# Patient Record
Sex: Female | Born: 1975 | Race: White | Hispanic: No | Marital: Married | Smoking: Never smoker
Health system: Southern US, Community
[De-identification: ages and names within clinical notes are randomized; demographics above are authoritative.]

## PROBLEM LIST (undated history)

## (undated) ENCOUNTER — Inpatient Hospital Stay (HOSPITAL_COMMUNITY): Payer: Self-pay

## (undated) DIAGNOSIS — D069 Carcinoma in situ of cervix, unspecified: Secondary | ICD-10-CM

## (undated) HISTORY — PX: TONSILLECTOMY: SUR1361

## (undated) HISTORY — PX: BREAST ENHANCEMENT SURGERY: SHX7

## (undated) HISTORY — PX: COMBINED HYSTEROSCOPY DIAGNOSTIC / D&C: SUR297

## (undated) HISTORY — PX: COLPOSCOPY: SHX161

## (undated) HISTORY — PX: CERVIX LESION DESTRUCTION: SHX591

## (undated) HISTORY — PX: OTHER SURGICAL HISTORY: SHX169

## (undated) HISTORY — DX: Carcinoma in situ of cervix, unspecified: D06.9

---

## 1998-06-11 ENCOUNTER — Ambulatory Visit (HOSPITAL_COMMUNITY): Admission: RE | Admit: 1998-06-11 | Discharge: 1998-06-11 | Payer: Self-pay | Admitting: Obstetrics and Gynecology

## 1998-06-16 ENCOUNTER — Inpatient Hospital Stay (HOSPITAL_COMMUNITY): Admission: AD | Admit: 1998-06-16 | Discharge: 1998-06-16 | Payer: Self-pay | Admitting: Obstetrics and Gynecology

## 1998-12-07 ENCOUNTER — Other Ambulatory Visit: Admission: RE | Admit: 1998-12-07 | Discharge: 1998-12-07 | Payer: Self-pay | Admitting: Obstetrics and Gynecology

## 1999-03-29 ENCOUNTER — Other Ambulatory Visit: Admission: RE | Admit: 1999-03-29 | Discharge: 1999-03-29 | Payer: Self-pay | Admitting: Obstetrics and Gynecology

## 1999-05-24 ENCOUNTER — Ambulatory Visit (HOSPITAL_COMMUNITY): Admission: RE | Admit: 1999-05-24 | Discharge: 1999-05-24 | Payer: Self-pay | Admitting: Obstetrics and Gynecology

## 1999-05-24 ENCOUNTER — Encounter (INDEPENDENT_AMBULATORY_CARE_PROVIDER_SITE_OTHER): Payer: Self-pay | Admitting: *Deleted

## 1999-06-16 ENCOUNTER — Other Ambulatory Visit: Admission: RE | Admit: 1999-06-16 | Discharge: 1999-06-16 | Payer: Self-pay | Admitting: Obstetrics and Gynecology

## 2000-06-13 ENCOUNTER — Other Ambulatory Visit: Admission: RE | Admit: 2000-06-13 | Discharge: 2000-06-13 | Payer: Self-pay | Admitting: Obstetrics and Gynecology

## 2000-08-31 ENCOUNTER — Ambulatory Visit (HOSPITAL_COMMUNITY): Admission: RE | Admit: 2000-08-31 | Discharge: 2000-08-31 | Payer: Self-pay | Admitting: Obstetrics and Gynecology

## 2000-12-25 ENCOUNTER — Other Ambulatory Visit: Admission: RE | Admit: 2000-12-25 | Discharge: 2000-12-25 | Payer: Self-pay | Admitting: Obstetrics and Gynecology

## 2001-07-16 ENCOUNTER — Other Ambulatory Visit: Admission: RE | Admit: 2001-07-16 | Discharge: 2001-07-16 | Payer: Self-pay | Admitting: Obstetrics and Gynecology

## 2001-09-25 ENCOUNTER — Encounter: Payer: Self-pay | Admitting: Obstetrics and Gynecology

## 2001-09-25 ENCOUNTER — Encounter: Admission: RE | Admit: 2001-09-25 | Discharge: 2001-09-25 | Payer: Self-pay | Admitting: Obstetrics and Gynecology

## 2002-07-07 ENCOUNTER — Encounter: Payer: Self-pay | Admitting: Emergency Medicine

## 2002-07-07 ENCOUNTER — Emergency Department (HOSPITAL_COMMUNITY): Admission: EM | Admit: 2002-07-07 | Discharge: 2002-07-07 | Payer: Self-pay | Admitting: Emergency Medicine

## 2002-09-19 ENCOUNTER — Other Ambulatory Visit: Admission: RE | Admit: 2002-09-19 | Discharge: 2002-09-19 | Payer: Self-pay | Admitting: Obstetrics and Gynecology

## 2003-09-21 ENCOUNTER — Other Ambulatory Visit: Admission: RE | Admit: 2003-09-21 | Discharge: 2003-09-21 | Payer: Self-pay | Admitting: Obstetrics and Gynecology

## 2004-10-13 ENCOUNTER — Other Ambulatory Visit: Admission: RE | Admit: 2004-10-13 | Discharge: 2004-10-13 | Payer: Self-pay | Admitting: Obstetrics and Gynecology

## 2005-02-20 ENCOUNTER — Other Ambulatory Visit: Admission: RE | Admit: 2005-02-20 | Discharge: 2005-02-20 | Payer: Self-pay | Admitting: Gynecology

## 2005-09-11 ENCOUNTER — Inpatient Hospital Stay (HOSPITAL_COMMUNITY): Admission: AD | Admit: 2005-09-11 | Discharge: 2005-09-14 | Payer: Self-pay | Admitting: Gynecology

## 2005-10-23 ENCOUNTER — Other Ambulatory Visit: Admission: RE | Admit: 2005-10-23 | Discharge: 2005-10-23 | Payer: Self-pay | Admitting: Gynecology

## 2006-11-01 ENCOUNTER — Other Ambulatory Visit: Admission: RE | Admit: 2006-11-01 | Discharge: 2006-11-01 | Payer: Self-pay | Admitting: Obstetrics and Gynecology

## 2007-03-14 ENCOUNTER — Ambulatory Visit: Payer: Self-pay | Admitting: Internal Medicine

## 2007-03-14 DIAGNOSIS — IMO0001 Reserved for inherently not codable concepts without codable children: Secondary | ICD-10-CM | POA: Insufficient documentation

## 2007-03-14 DIAGNOSIS — G44209 Tension-type headache, unspecified, not intractable: Secondary | ICD-10-CM

## 2007-11-29 ENCOUNTER — Other Ambulatory Visit: Admission: RE | Admit: 2007-11-29 | Discharge: 2007-11-29 | Payer: Self-pay | Admitting: Obstetrics and Gynecology

## 2008-06-16 ENCOUNTER — Ambulatory Visit: Payer: Self-pay | Admitting: Family Medicine

## 2008-06-16 DIAGNOSIS — J1189 Influenza due to unidentified influenza virus with other manifestations: Secondary | ICD-10-CM | POA: Insufficient documentation

## 2008-10-16 ENCOUNTER — Telehealth: Payer: Self-pay | Admitting: Family Medicine

## 2009-06-24 ENCOUNTER — Other Ambulatory Visit: Admission: RE | Admit: 2009-06-24 | Discharge: 2009-06-24 | Payer: Self-pay | Admitting: Obstetrics and Gynecology

## 2009-06-24 ENCOUNTER — Encounter: Payer: Self-pay | Admitting: Obstetrics and Gynecology

## 2009-06-24 ENCOUNTER — Ambulatory Visit: Payer: Self-pay | Admitting: Obstetrics and Gynecology

## 2009-09-15 ENCOUNTER — Ambulatory Visit: Payer: Self-pay | Admitting: Obstetrics and Gynecology

## 2009-09-20 ENCOUNTER — Ambulatory Visit (HOSPITAL_BASED_OUTPATIENT_CLINIC_OR_DEPARTMENT_OTHER): Admission: RE | Admit: 2009-09-20 | Discharge: 2009-09-20 | Payer: Self-pay | Admitting: Obstetrics and Gynecology

## 2009-09-20 ENCOUNTER — Ambulatory Visit: Payer: Self-pay | Admitting: Obstetrics and Gynecology

## 2009-09-22 ENCOUNTER — Ambulatory Visit: Payer: Self-pay | Admitting: Obstetrics and Gynecology

## 2009-09-24 ENCOUNTER — Ambulatory Visit: Payer: Self-pay | Admitting: Obstetrics and Gynecology

## 2009-09-29 ENCOUNTER — Ambulatory Visit: Payer: Self-pay | Admitting: Gynecology

## 2009-10-04 ENCOUNTER — Ambulatory Visit: Payer: Self-pay | Admitting: Gynecology

## 2009-10-12 ENCOUNTER — Ambulatory Visit: Payer: Self-pay | Admitting: Obstetrics and Gynecology

## 2009-10-26 ENCOUNTER — Ambulatory Visit: Payer: Self-pay | Admitting: Obstetrics and Gynecology

## 2009-11-22 ENCOUNTER — Ambulatory Visit: Payer: Self-pay | Admitting: Obstetrics and Gynecology

## 2010-06-27 ENCOUNTER — Other Ambulatory Visit: Admission: RE | Admit: 2010-06-27 | Discharge: 2010-06-27 | Payer: Self-pay | Admitting: Obstetrics and Gynecology

## 2010-06-27 ENCOUNTER — Ambulatory Visit: Payer: Self-pay | Admitting: Obstetrics and Gynecology

## 2010-12-05 ENCOUNTER — Ambulatory Visit (INDEPENDENT_AMBULATORY_CARE_PROVIDER_SITE_OTHER): Payer: 59 | Admitting: Obstetrics and Gynecology

## 2010-12-05 DIAGNOSIS — Z30431 Encounter for routine checking of intrauterine contraceptive device: Secondary | ICD-10-CM

## 2010-12-30 NOTE — Op Note (Signed)
[facility]  Patient:    Ann Haas, Ann Haas                    MRN: [id number] Proc. Date: 08/31/00 Adm. Date:  [id number] Disc. Date: [id number] Attending:  Sharon Mt                           Operative Report  PREOPERATIVE DIAGNOSES:  Symptomatic hymenal remnant.  POSTOPERATIVE DIAGNOSES:  Symptomatic hymenal remnant.  OPERATION PERFORMED:  Excision of hymenal remnant.  SURGEON:  Dr. Eda Paschal.  ANESTHESIA:  MAC plus local infiltrate of anesthesia.  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 It has caused both irritation with clothing as well as dyspareunia and as a result of this, she has elected to have this removed.  FINDINGS:  External examination shows a very long hymenal remnant coming off near the urethra. It did not appear to have any continuity with the urethra itself.  DESCRIPTION OF PROCEDURE:  The patient was taken to the operating room, placed in the dorsal lithotomy position, prepped and draped in the usual sterile fashion. She was given IV sedation by anesthesia and local 1% xylocaine infiltrated with anesthesia by Dr. Eda Paschal. Initially the hymenial remnant was removed with a Bovie using both coag and cutting. Care was taken to stay far from the integrity of the urethra. Once the area had been excised, there was a mucosa defect that had been created. A Robinson catheter was placed in the urethra to be sure that the urethra had not been altered in anyway and it had not. Urine that drained at this point was clear. Using interrupted 3-0 Monocryls, the mucosal defect was closed. It took three silk sutures to close. Estimated blood loss was less than 10 cc with none replaced. The patient tolerated the procedure well and left the operating room in satisfactory condition. DD:  08/31/00 TD:  09/01/00 Job: [id number]

## 2011-01-16 ENCOUNTER — Ambulatory Visit: Payer: 59 | Admitting: Obstetrics and Gynecology

## 2011-01-24 ENCOUNTER — Ambulatory Visit: Payer: 59 | Admitting: Obstetrics and Gynecology

## 2011-01-31 ENCOUNTER — Ambulatory Visit (INDEPENDENT_AMBULATORY_CARE_PROVIDER_SITE_OTHER): Payer: 59 | Admitting: Obstetrics and Gynecology

## 2011-01-31 DIAGNOSIS — N912 Amenorrhea, unspecified: Secondary | ICD-10-CM

## 2011-07-17 ENCOUNTER — Ambulatory Visit (INDEPENDENT_AMBULATORY_CARE_PROVIDER_SITE_OTHER): Payer: No Typology Code available for payment source | Admitting: Family Medicine

## 2011-07-17 ENCOUNTER — Encounter: Payer: Self-pay | Admitting: Family Medicine

## 2011-07-17 VITALS — BP 98/66 | HR 69 | Temp 98.6°F | Wt 112.0 lb

## 2011-07-17 DIAGNOSIS — J329 Chronic sinusitis, unspecified: Secondary | ICD-10-CM

## 2011-07-17 MED ORDER — LEVOFLOXACIN 500 MG PO TABS: 500.0000 mg | ORAL_TABLET | Freq: Every day | ORAL | Status: DC

## 2011-07-17 MED ORDER — FLUTICASONE PROPIONATE 50 MCG/ACT NA SUSP: 2.0000 | Freq: Every day | NASAL | Status: DC

## 2011-07-17 MED ORDER — FLUCONAZOLE 150 MG PO TABS: 150.0000 mg | ORAL_TABLET | Freq: Once | ORAL | Status: AC

## 2011-07-17 NOTE — Progress Notes (Signed)
  Subjective:    Patient ID: Lauretta Grill, female    DOB: 05-20-1976, 35 y.o.   MRN: [id number]  HPI Here for recurrent sinus infections. She has had 4 of these in the past year, and autumn seems to be the worst time of year for her. She went to Urgent Care about a week ago for the typical sinus pressure, PND, HA, blowing green mucus from the nose, and a dry cough. No fever. She was put on Biaxin, but this has not helped. She tends to have some allergy symptoms most of the time like nasal congestion and sneezing.    Review of Systems  Constitutional: Negative.   HENT: Positive for congestion, postnasal drip and sinus pressure.   Eyes: Negative.   Respiratory: Positive for cough.        Objective:   Physical Exam  Constitutional: She appears well-developed and well-nourished.  HENT:  Right Ear: External ear normal.  Left Ear: External ear normal.  Nose: Nose normal.  Mouth/Throat: Oropharynx is clear and moist. No oropharyngeal exudate.  Eyes: Conjunctivae are normal. Pupils are equal, round, and reactive to light.  Neck: No thyromegaly present.  Pulmonary/Chest: Effort normal and breath sounds normal.  Lymphadenopathy:    She has no cervical adenopathy.          Assessment & Plan:  For the current infection, stop the Biaxin and start on 14 days of Levquin. For the allergies, start on Claritin daily and Flonase sprays daily. Get a sinus CT scan to loof for any structural issues.

## 2011-07-19 ENCOUNTER — Telehealth: Payer: Self-pay | Admitting: Family Medicine

## 2011-07-19 NOTE — Telephone Encounter (Signed)
I spoke to her about this. She will stop the Levaquin and go back on the Biaxin she had gotten at the Urgent Care clinic. She does feel better now. Her sinus CT is set up for next week.

## 2011-07-19 NOTE — Telephone Encounter (Signed)
Pt called and is having reaction to abx levofloxacin (LEVAQUIN) 500 MG tablet . Pt is having anxiety issues, panic attacks and is shaky. Pt is due to take this med again at 12:30pm today, and pt does not want to take med again. Pt doen't know if she should start back on old abx or what?

## 2011-07-26 ENCOUNTER — Other Ambulatory Visit: Payer: Self-pay | Admitting: Family Medicine

## 2011-07-26 ENCOUNTER — Ambulatory Visit (INDEPENDENT_AMBULATORY_CARE_PROVIDER_SITE_OTHER)
Admission: RE | Admit: 2011-07-26 | Discharge: 2011-07-26 | Disposition: A | Discharge status: Home / Self Care | Payer: No Typology Code available for payment source | Source: Ambulatory Visit | Attending: Family Medicine | Admitting: Family Medicine

## 2011-07-26 DIAGNOSIS — J329 Chronic sinusitis, unspecified: Secondary | ICD-10-CM

## 2011-07-27 NOTE — Progress Notes (Signed)
Quick Note:  Left voice message ______ 

## 2011-07-28 ENCOUNTER — Telehealth: Payer: Self-pay | Admitting: Family Medicine

## 2011-07-28 DIAGNOSIS — J320 Chronic maxillary sinusitis: Secondary | ICD-10-CM

## 2011-07-28 NOTE — Telephone Encounter (Signed)
Left voice message.

## 2011-07-28 NOTE — Telephone Encounter (Signed)
Pt called and would like the referral for the ENT, no one in particular. ( referral due to recent scan )

## 2011-07-28 NOTE — Telephone Encounter (Signed)
Tell her I did the referral to [facility]. Camelia Eng will call with the appt info next week

## 2011-08-11 ENCOUNTER — Encounter: Payer: 59 | Admitting: Obstetrics and Gynecology

## 2011-09-08 ENCOUNTER — Ambulatory Visit (HOSPITAL_BASED_OUTPATIENT_CLINIC_OR_DEPARTMENT_OTHER)
Admission: RE | Admit: 2011-09-08 | Payer: No Typology Code available for payment source | Source: Ambulatory Visit | Admitting: Otolaryngology

## 2011-09-08 ENCOUNTER — Encounter (HOSPITAL_BASED_OUTPATIENT_CLINIC_OR_DEPARTMENT_OTHER): Admission: RE | Payer: Self-pay | Source: Ambulatory Visit

## 2011-09-08 SURGERY — MAXILLARY ANTROSTOMY
Anesthesia: General | Laterality: Bilateral

## 2011-09-27 ENCOUNTER — Encounter: Payer: Self-pay | Admitting: Obstetrics and Gynecology

## 2011-09-27 ENCOUNTER — Ambulatory Visit (INDEPENDENT_AMBULATORY_CARE_PROVIDER_SITE_OTHER): Payer: No Typology Code available for payment source | Admitting: Obstetrics and Gynecology

## 2011-09-27 ENCOUNTER — Other Ambulatory Visit (HOSPITAL_COMMUNITY)
Admission: RE | Admit: 2011-09-27 | Discharge: 2011-09-27 | Disposition: A | Discharge status: Home / Self Care | Payer: No Typology Code available for payment source | Source: Ambulatory Visit | Attending: Obstetrics and Gynecology | Admitting: Obstetrics and Gynecology

## 2011-09-27 DIAGNOSIS — Z30431 Encounter for routine checking of intrauterine contraceptive device: Secondary | ICD-10-CM

## 2011-09-27 DIAGNOSIS — Z01419 Encounter for gynecological examination (general) (routine) without abnormal findings: Secondary | ICD-10-CM | POA: Insufficient documentation

## 2011-09-27 DIAGNOSIS — Z833 Family history of diabetes mellitus: Secondary | ICD-10-CM

## 2011-09-27 LAB — CBC WITH DIFFERENTIAL/PLATELET
Eosinophils Absolute: 0.1 10*3/uL (ref 0.0–0.7)
Eosinophils Relative: 1 % (ref 0–5)
HCT: 39.6 % (ref 36.0–46.0)
Hemoglobin: 13.7 g/dL (ref 12.0–15.0)
Lymphocytes Relative: 34 % (ref 12–46)
Lymphs Abs: 2.8 10*3/uL (ref 0.7–4.0)
MCH: 31.2 pg (ref 26.0–34.0)
MCV: 90.2 fL (ref 78.0–100.0)
Monocytes Absolute: 0.5 10*3/uL (ref 0.1–1.0)
Monocytes Relative: 7 % (ref 3–12)
RBC: 4.39 MIL/uL (ref 3.87–5.11)
WBC: 8 10*3/uL (ref 4.0–10.5)

## 2011-09-27 LAB — URINALYSIS W MICROSCOPIC + REFLEX CULTURE
Bilirubin Urine: NEGATIVE
Hgb urine dipstick: NEGATIVE
Ketones, ur: NEGATIVE mg/dL
Nitrite: NEGATIVE
Protein, ur: NEGATIVE mg/dL
Specific Gravity, Urine: 1.015 (ref 1.005–1.030)
Urobilinogen, UA: 0.2 mg/dL (ref 0.0–1.0)

## 2011-09-27 NOTE — Progress Notes (Signed)
Patient came to see me today for her annual GYN exam. She is amenorrheic on her IUD. She is thinking of having another child. She does not want to remove the IUD yet. She is having problems with recurrent sinusitis that she sees her PCP for. Otherwise she is doing well. She is having no pelvic pain.  Physical examination: Kennon Portela present. HEENT within normal limits. Neck: Thyroid not large. No masses. Supraclavicular nodes: not enlarged. Breasts: Examined in both sitting midline position. No skin changes and no masses. Abdomen: Soft no guarding rebound or masses or hernia. Pelvic: External: Within normal limits. BUS: Within normal limits. Vaginal:within normal limits. Good estrogen effect. No evidence of cystocele rectocele or enterocele. Cervix: clean. IUD string visible. Uterus: Normal size and shape. Adnexa: No masses. Rectovaginal exam: Confirmatory and negative. Extremities: Within normal limits.  Assessment: Normal GYN exam  Plan: Return for Mirena IUD removal when she is ready to conceive.

## 2012-03-08 ENCOUNTER — Ambulatory Visit: Payer: No Typology Code available for payment source | Admitting: Gynecology

## 2012-03-15 ENCOUNTER — Ambulatory Visit: Payer: No Typology Code available for payment source | Admitting: Obstetrics and Gynecology

## 2012-10-03 ENCOUNTER — Encounter: Payer: No Typology Code available for payment source | Admitting: Gynecology

## 2012-10-15 ENCOUNTER — Encounter: Payer: No Typology Code available for payment source | Admitting: Gynecology

## 2012-11-08 ENCOUNTER — Encounter: Payer: No Typology Code available for payment source | Admitting: Gynecology

## 2013-01-16 IMAGING — CT CT PARANASAL SINUSES LIMITED
1 of 2 series · 16 of 19 positions shown, 20 images · non-contrast
Comparison: None.

CLINICAL DATA: 35-year-old female with recurrent sinus infections,
most recently 2 2 weeks ago.

CT LIMITED SINUSES WITHOUT CONTRAST
TECHNIQUE: Multidetector CT images of the paranasal sinuses were
obtained in a single plane without contrast.

[Series 4: ltd sinus 3.0 h30s · axial · 0.36mm/px · z∈[-104,-7]mm · 16 of 18 slices shown, 20 images]
[im 2/18  brain]
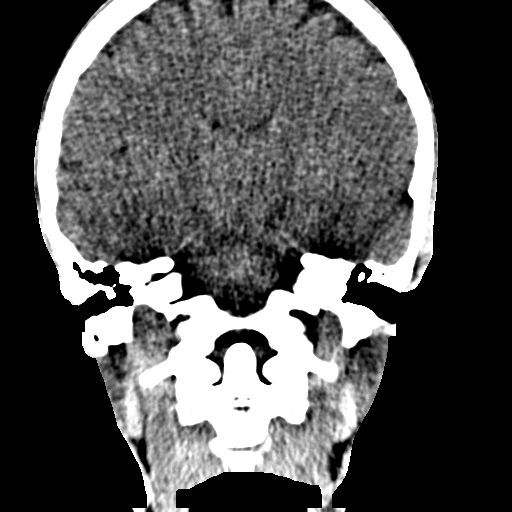
[im 2/18  bone]
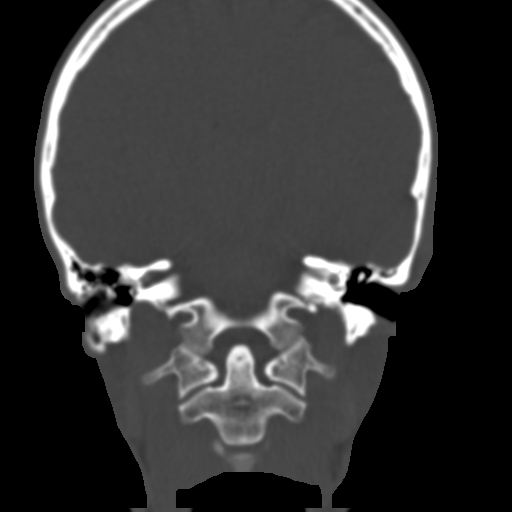
[im 3/18  bone]
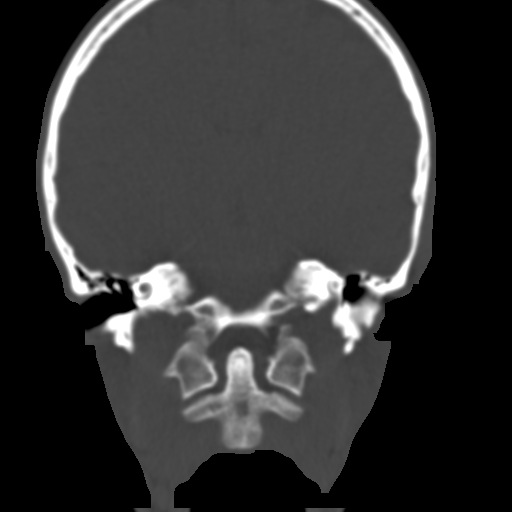
[im 4/18  bone]
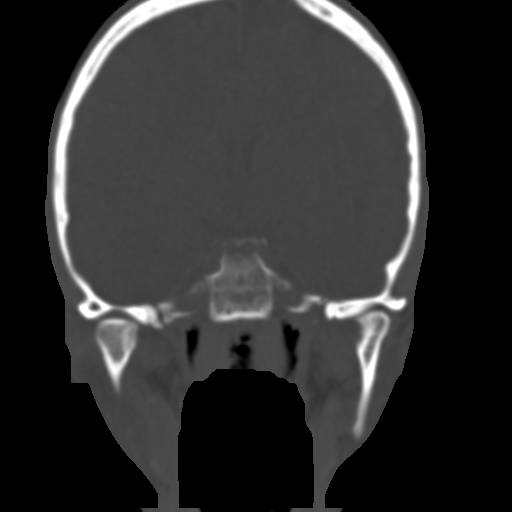
[im 5/18  bone]
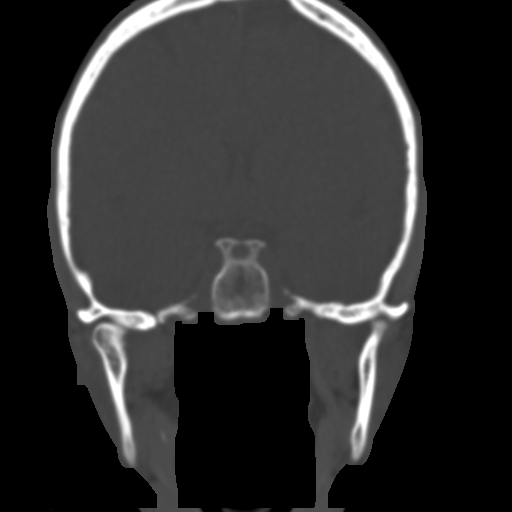
[im 6/18  brain]
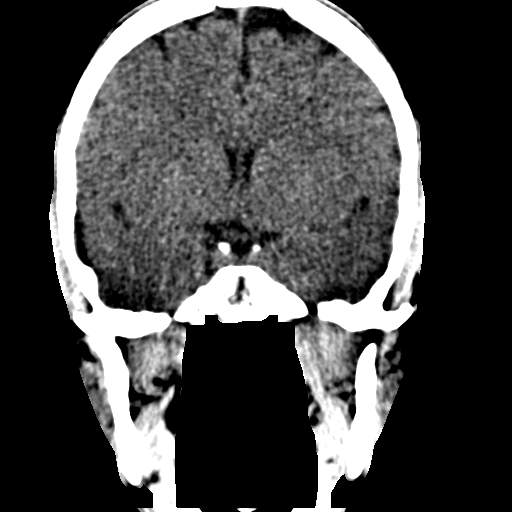
[im 6/18  bone]
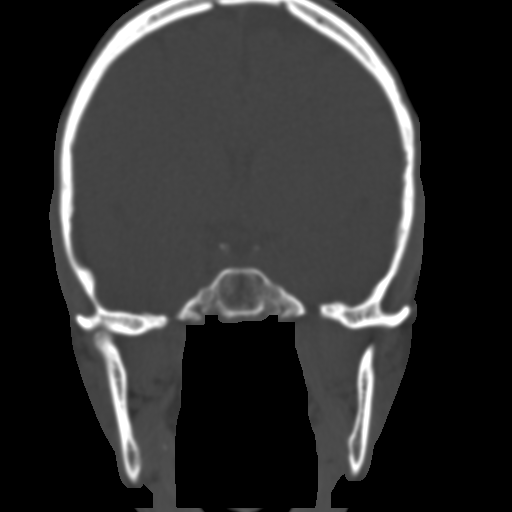
[im 7/18  bone]
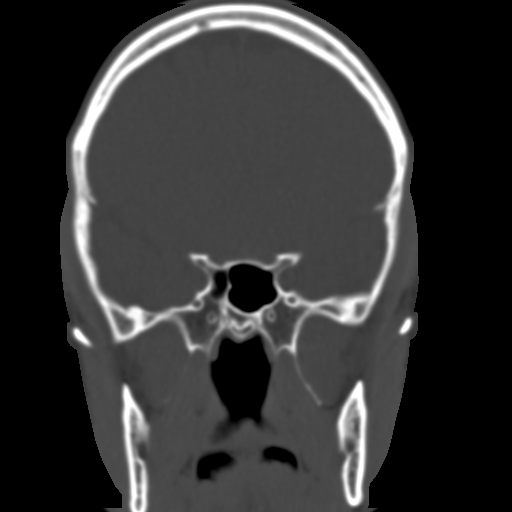
[im 8/18  bone]
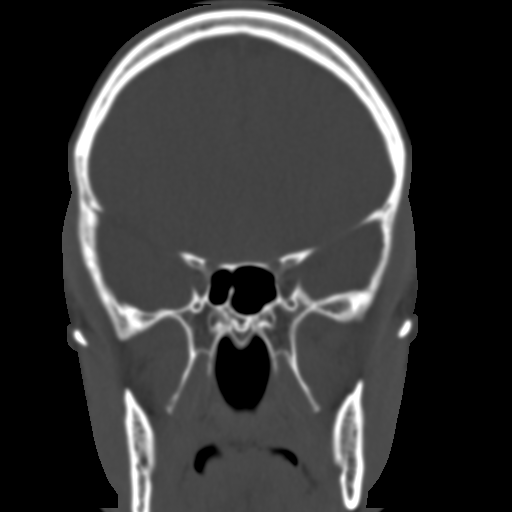
[im 9/18  bone]
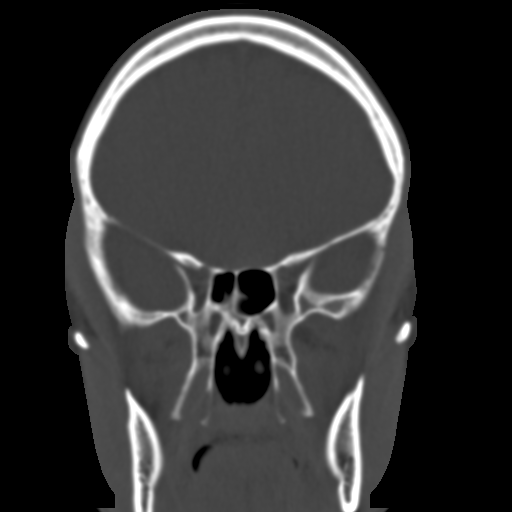
[im 10/18  brain]
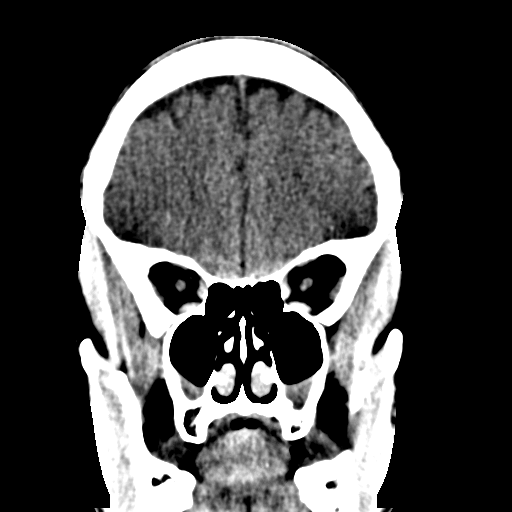
[im 10/18  bone]
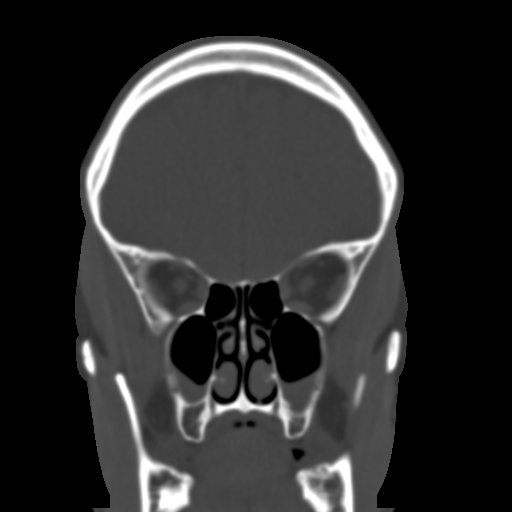
[im 11/18  bone]
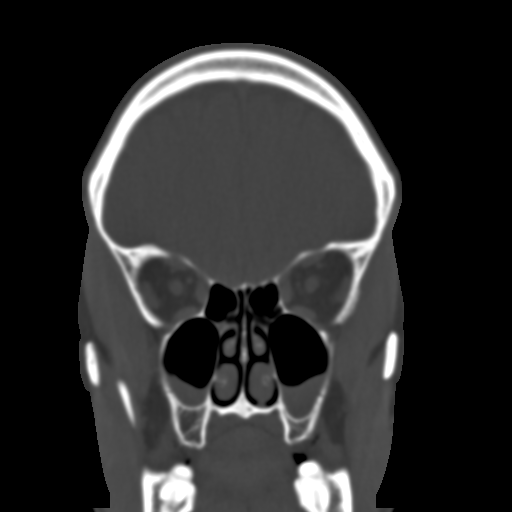
[im 12/18  bone]
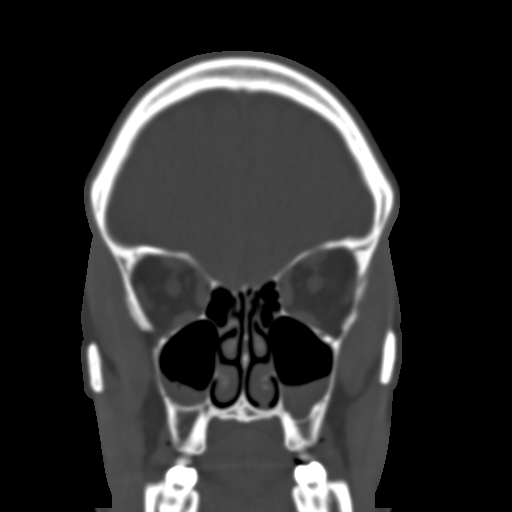
[im 13/18  bone]
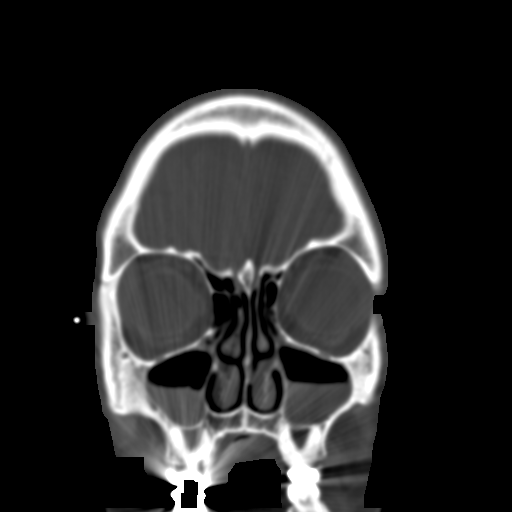
[im 14/18  brain]
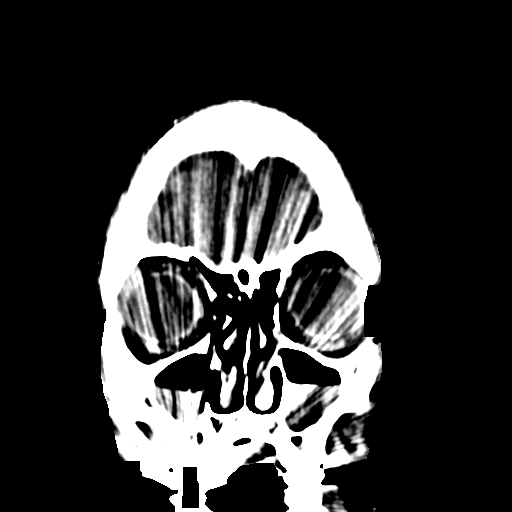
[im 14/18  bone]
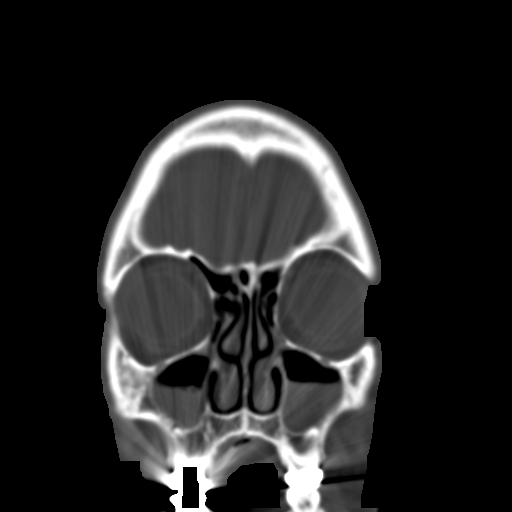
[im 15/18  bone]
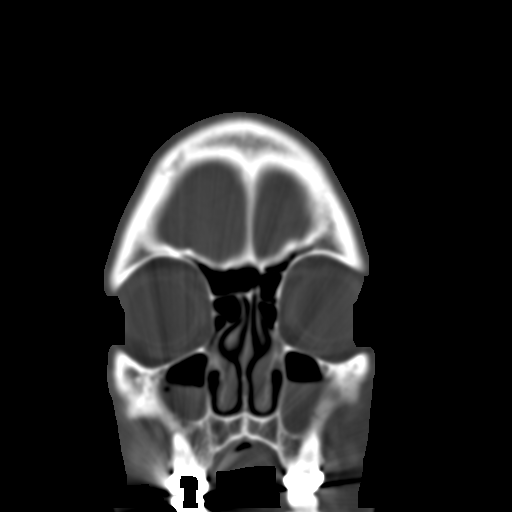
[im 16/18  bone]
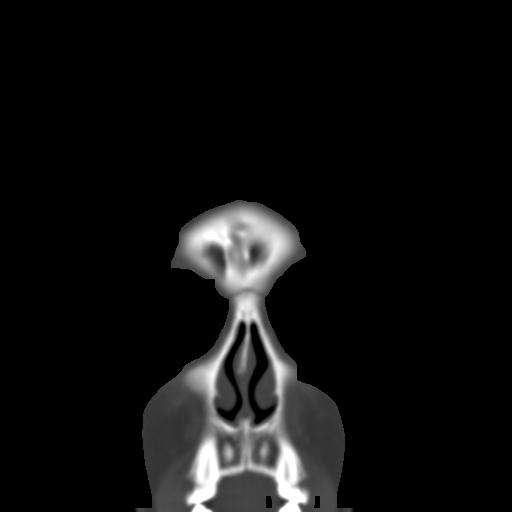
[im 17/18  bone]
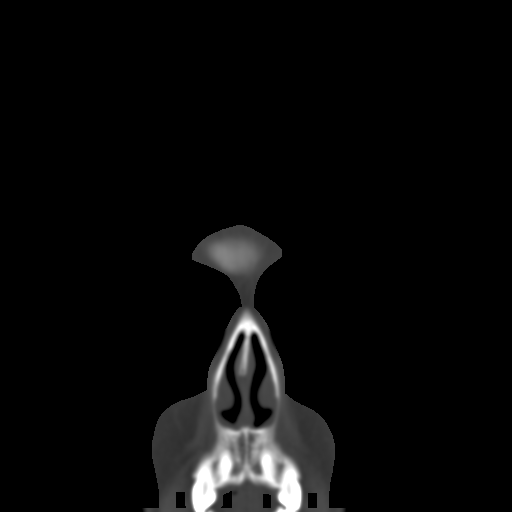

[16 of 19 positions shown; findings below may reference images not displayed]

FINDINGS: Negative visualized noncontrast brain parenchyma.
Visualized orbit soft tissues are within normal limits.  Negative
visualized face soft tissues.

Visualized tympanic cavities and mastoids are clear.

The sphenoid sinuses are clear.
The posterior ethmoids are clear.  There is minimal to mild mucosal
thickening in the anterior ethmoids.
The visualized frontal sinuses are clear.
There are fluid levels in both maxillary sinuses.

Suggestion of nasal cavity mucosal thickening.

No acute osseous abnormality identified.
IMPRESSION: Bilateral maxillary sinus air-fluid levels compatible with acute
sinusitis.

## 2013-04-16 LAB — OB RESULTS CONSOLE ABO/RH: RH Type: POSITIVE

## 2013-04-16 LAB — OB RESULTS CONSOLE GC/CHLAMYDIA
CHLAMYDIA, DNA PROBE: NEGATIVE
GC PROBE AMP, GENITAL: NEGATIVE

## 2013-04-16 LAB — OB RESULTS CONSOLE HEPATITIS B SURFACE ANTIGEN: Hepatitis B Surface Ag: NEGATIVE

## 2013-04-16 LAB — OB RESULTS CONSOLE RUBELLA ANTIBODY, IGM: Rubella: IMMUNE

## 2013-04-16 LAB — OB RESULTS CONSOLE HIV ANTIBODY (ROUTINE TESTING): HIV: NONREACTIVE

## 2013-04-16 LAB — OB RESULTS CONSOLE ANTIBODY SCREEN: Antibody Screen: NEGATIVE

## 2013-04-16 LAB — OB RESULTS CONSOLE RPR: RPR: NONREACTIVE

## 2013-07-01 ENCOUNTER — Encounter (HOSPITAL_COMMUNITY): Payer: Self-pay | Admitting: *Deleted

## 2013-07-01 ENCOUNTER — Inpatient Hospital Stay (HOSPITAL_COMMUNITY): Payer: BC Managed Care – PPO

## 2013-07-01 ENCOUNTER — Inpatient Hospital Stay (HOSPITAL_COMMUNITY)
Admission: AD | Admit: 2013-07-01 | Discharge: 2013-07-01 | Disposition: A | Discharge status: Home / Self Care | Payer: BC Managed Care – PPO | Source: Ambulatory Visit | Attending: Obstetrics and Gynecology | Admitting: Obstetrics and Gynecology

## 2013-07-01 DIAGNOSIS — R0602 Shortness of breath: Secondary | ICD-10-CM | POA: Insufficient documentation

## 2013-07-01 DIAGNOSIS — O99891 Other specified diseases and conditions complicating pregnancy: Secondary | ICD-10-CM | POA: Insufficient documentation

## 2013-07-01 DIAGNOSIS — IMO0002 Reserved for concepts with insufficient information to code with codable children: Secondary | ICD-10-CM | POA: Insufficient documentation

## 2013-07-01 LAB — COMPREHENSIVE METABOLIC PANEL
ALT: 15 U/L (ref 0–35)
AST: 25 U/L (ref 0–37)
CO2: 27 mEq/L (ref 19–32)
Calcium: 9 mg/dL (ref 8.4–10.5)
Chloride: 103 mEq/L (ref 96–112)
Creatinine, Ser: 0.45 mg/dL — ABNORMAL LOW (ref 0.50–1.10)
GFR calc Af Amer: >90 mL/min (ref >90)
GFR calc non Af Amer: >90 mL/min (ref >90)
Glucose, Bld: 70 mg/dL (ref 70–99)
Total Bilirubin: 0.1 mg/dL — ABNORMAL LOW (ref 0.3–1.2)

## 2013-07-01 LAB — CBC
HCT: 34.1 % — ABNORMAL LOW (ref 36.0–46.0)
Hemoglobin: 11.9 g/dL — ABNORMAL LOW (ref 12.0–15.0)
MCH: 30.8 pg (ref 26.0–34.0)
MCV: 88.3 fL (ref 78.0–100.0)
Platelets: 133 10*3/uL — ABNORMAL LOW (ref 150–400)
RBC: 3.86 MIL/uL — ABNORMAL LOW (ref 3.87–5.11)
WBC: 10.4 10*3/uL (ref 4.0–10.5)

## 2013-07-01 NOTE — MAU Note (Signed)
EKG being done

## 2013-07-01 NOTE — MAU Note (Signed)
Been feeling great up until yesterday, 'all this swelling'.  9#wk gain noted at office per Dr Henderson Cloud. Reports a little short of breath. Pain from swelling, in lower extremities.

## 2013-08-14 NOTE — L&D Delivery Note (Signed)
Delivery Note At 2:54 AM a viable female was delivered via Vaginal, Spontaneous Delivery (Presentation: Left Occiput Anterior).  APGAR: 9, 9; weight .   Placenta status: Intact, Spontaneous.  Cord: 3 vessels with the following complications: None.  Cord pH: not obtained  Anesthesia: Epidural  Episiotomy: None Lacerations: none Suture Repair: 3.0 chromic Est. Blood Loss (mL): 500  Mom to postpartum.  Baby to Couplet care / Skin to Skin.  Kerri Kovacik L 11/01/2013, 3:50 AM

## 2013-10-16 LAB — OB RESULTS CONSOLE GBS: STREP GROUP B AG: NEGATIVE

## 2013-10-31 ENCOUNTER — Encounter (HOSPITAL_COMMUNITY): Payer: Self-pay

## 2013-10-31 ENCOUNTER — Inpatient Hospital Stay (HOSPITAL_COMMUNITY)
Admission: AD | Admit: 2013-10-31 | Discharge: 2013-11-02 | DRG: 768 | Disposition: A | Discharge status: Home / Self Care | Payer: BC Managed Care – PPO | Source: Ambulatory Visit | Attending: Obstetrics and Gynecology | Admitting: Obstetrics and Gynecology

## 2013-10-31 ENCOUNTER — Inpatient Hospital Stay (HOSPITAL_COMMUNITY): Payer: BC Managed Care – PPO | Admitting: Anesthesiology

## 2013-10-31 DIAGNOSIS — D696 Thrombocytopenia, unspecified: Secondary | ICD-10-CM | POA: Diagnosis present

## 2013-10-31 DIAGNOSIS — O4590 Premature separation of placenta, unspecified, unspecified trimester: Principal | ICD-10-CM | POA: Diagnosis present

## 2013-10-31 DIAGNOSIS — IMO0001 Reserved for inherently not codable concepts without codable children: Secondary | ICD-10-CM

## 2013-10-31 DIAGNOSIS — O09529 Supervision of elderly multigravida, unspecified trimester: Secondary | ICD-10-CM | POA: Diagnosis present

## 2013-10-31 DIAGNOSIS — G44209 Tension-type headache, unspecified, not intractable: Secondary | ICD-10-CM

## 2013-10-31 DIAGNOSIS — D689 Coagulation defect, unspecified: Secondary | ICD-10-CM | POA: Diagnosis present

## 2013-10-31 DIAGNOSIS — O9912 Other diseases of the blood and blood-forming organs and certain disorders involving the immune mechanism complicating childbirth: Secondary | ICD-10-CM

## 2013-10-31 DIAGNOSIS — J1189 Influenza due to unidentified influenza virus with other manifestations: Secondary | ICD-10-CM

## 2013-10-31 LAB — CBC
HCT: 39.6 % (ref 36.0–46.0)
HCT: 38 % (ref 36.0–46.0)
HEMOGLOBIN: 14 g/dL (ref 12.0–15.0)
HEMOGLOBIN: 13.3 g/dL (ref 12.0–15.0)
MCH: 30.9 pg (ref 26.0–34.0)
MCH: 30.6 pg (ref 26.0–34.0)
MCHC: 35.4 g/dL (ref 30.0–36.0)
MCHC: 35 g/dL (ref 30.0–36.0)
MCV: 87.4 fL (ref 78.0–100.0)
MCV: 87.4 fL (ref 78.0–100.0)
PLATELETS: 88 10*3/uL — AB (ref 150–400)
Platelets: 84 10*3/uL — ABNORMAL LOW (ref 150–400)
RBC: 4.53 MIL/uL (ref 3.87–5.11)
RBC: 4.35 MIL/uL (ref 3.87–5.11)
RDW: 13 % (ref 11.5–15.5)
RDW: 13 % (ref 11.5–15.5)
WBC: 10.3 10*3/uL (ref 4.0–10.5)
WBC: 11.1 10*3/uL — AB (ref 4.0–10.5)

## 2013-10-31 LAB — RPR: RPR Ser Ql: NONREACTIVE

## 2013-10-31 LAB — TYPE AND SCREEN
ABO/RH(D): A POS
Antibody Screen: NEGATIVE

## 2013-10-31 LAB — ABO/RH: ABO/RH(D): A POS

## 2013-10-31 MED ORDER — LACTATED RINGERS IV SOLN
INTRAVENOUS | Status: DC
  Administered 2013-10-31 (×2): via INTRAVENOUS

## 2013-10-31 MED ORDER — CITRIC ACID-SODIUM CITRATE 334-500 MG/5ML PO SOLN
30.0000 mL | ORAL | Status: DC | PRN
Start: 2013-10-31 — End: 2013-11-01
  Filled 2013-10-31: qty 15

## 2013-10-31 MED ORDER — IBUPROFEN 600 MG PO TABS: 600.0000 mg | ORAL_TABLET | Freq: Four times a day (QID) | ORAL | Status: DC | PRN

## 2013-10-31 MED ORDER — LIDOCAINE HCL (PF) 1 % IJ SOLN
30.0000 mL | INTRAMUSCULAR | Status: DC | PRN
  Filled 2013-10-31: qty 30

## 2013-10-31 MED ORDER — EPHEDRINE 5 MG/ML INJ
10.0000 mg | INTRAVENOUS | Status: DC | PRN
  Filled 2013-10-31: qty 4

## 2013-10-31 MED ORDER — ONDANSETRON HCL 4 MG/2ML IJ SOLN: 4.0000 mg | Freq: Four times a day (QID) | INTRAMUSCULAR | Status: DC | PRN

## 2013-10-31 MED ORDER — OXYTOCIN 40 UNITS IN LACTATED RINGERS INFUSION - SIMPLE MED
1.0000 m[IU]/min | INTRAVENOUS | Status: DC
  Administered 2013-10-31: 2 m[IU]/min via INTRAVENOUS

## 2013-10-31 MED ORDER — OXYTOCIN 40 UNITS IN LACTATED RINGERS INFUSION - SIMPLE MED
62.5000 mL/h | INTRAVENOUS | Status: DC
  Filled 2013-10-31: qty 1000

## 2013-10-31 MED ORDER — ACETAMINOPHEN 325 MG PO TABS: 650.0000 mg | ORAL_TABLET | ORAL | Status: DC | PRN

## 2013-10-31 MED ORDER — LACTATED RINGERS IV SOLN: 500.0000 mL | INTRAVENOUS | Status: DC | PRN

## 2013-10-31 MED ORDER — PHENYLEPHRINE 40 MCG/ML (10ML) SYRINGE FOR IV PUSH (FOR BLOOD PRESSURE SUPPORT): 80.0000 ug | PREFILLED_SYRINGE | INTRAVENOUS | Status: DC | PRN

## 2013-10-31 MED ORDER — TERBUTALINE SULFATE 1 MG/ML IJ SOLN: 0.2500 mg | Freq: Once | INTRAMUSCULAR | Status: AC | PRN

## 2013-10-31 MED ORDER — EPHEDRINE 5 MG/ML INJ: 10.0000 mg | INTRAVENOUS | Status: DC | PRN

## 2013-10-31 MED ORDER — FENTANYL 2.5 MCG/ML BUPIVACAINE 1/10 % EPIDURAL INFUSION (WH - ANES)
14.0000 mL/h | INTRAMUSCULAR | Status: DC | PRN
  Administered 2013-10-31: 14 mL/h via EPIDURAL
  Filled 2013-10-31 (×2): qty 125

## 2013-10-31 MED ORDER — LIDOCAINE HCL (PF) 1 % IJ SOLN
INTRAMUSCULAR | Status: DC | PRN
  Administered 2013-10-31 (×2): 5 mL

## 2013-10-31 MED ORDER — PHENYLEPHRINE 40 MCG/ML (10ML) SYRINGE FOR IV PUSH (FOR BLOOD PRESSURE SUPPORT)
80.0000 ug | PREFILLED_SYRINGE | INTRAVENOUS | Status: DC | PRN
  Filled 2013-10-31: qty 10

## 2013-10-31 MED ORDER — OXYTOCIN BOLUS FROM INFUSION: 500.0000 mL | INTRAVENOUS | Status: DC

## 2013-10-31 MED ORDER — LACTATED RINGERS IV SOLN: 500.0000 mL | Freq: Once | INTRAVENOUS | Status: DC

## 2013-10-31 MED ORDER — DIPHENHYDRAMINE HCL 50 MG/ML IJ SOLN: 12.5000 mg | INTRAMUSCULAR | Status: DC | PRN

## 2013-10-31 NOTE — Anesthesia Preprocedure Evaluation (Signed)
Anesthesia Evaluation  Patient identified by MRN, date of birth, ID band Patient awake    Reviewed: Allergy & Precautions, H&P , Patient's Chart, lab work & pertinent test results  Airway Mallampati: II TM Distance: >3 FB Neck ROM: full    Dental   Pulmonary  breath sounds clear to auscultation        Cardiovascular Rhythm:regular Rate:Normal     Neuro/Psych    GI/Hepatic   Endo/Other    Renal/GU      Musculoskeletal   Abdominal   Peds  Hematology   Anesthesia Other Findings   Reproductive/Obstetrics (+) Pregnancy                           Anesthesia Physical Anesthesia Plan  ASA: III  Anesthesia Plan: Epidural   Post-op Pain Management:    Induction:   Airway Management Planned:   Additional Equipment:   Intra-op Plan:   Post-operative Plan:   Informed Consent: I have reviewed the patients History and Physical, chart, labs and discussed the procedure including the risks, benefits and alternatives for the proposed anesthesia with the patient or authorized representative who has indicated his/her understanding and acceptance.     Plan Discussed with:   Anesthesia Plan Comments:        discussed risks and benefits with thrombocytopenia patient understands and we will proceed pending results of her repeat platelet count Anesthesia Quick Evaluation

## 2013-10-31 NOTE — Anesthesia Procedure Notes (Signed)
Epidural Patient location during procedure: OB Start time: 10/31/2013 4:05 PM  Staffing Anesthesiologist: Brayton CavesJACKSON, Akua Blethen Performed by: anesthesiologist   Preanesthetic Checklist Completed: patient identified, site marked, surgical consent, pre-op evaluation, timeout performed, IV checked, risks and benefits discussed and monitors and equipment checked  Epidural Patient position: sitting Prep: site prepped and draped and DuraPrep Patient monitoring: continuous pulse ox and blood pressure Approach: midline Injection technique: LOR air  Needle:  Needle type: Tuohy  Needle gauge: 17 G Needle length: 9 cm and 9 Needle insertion depth: 5 cm cm Catheter type: closed end flexible Catheter size: 19 Gauge Catheter at skin depth: 10 cm Test dose: negative  Assessment Events: blood not aspirated, injection not painful, no injection resistance, negative IV test and no paresthesia  Additional Notes Patient identified.  Risk benefits discussed including failed block, incomplete pain control, headache, nerve damage, paralysis, blood pressure changes, nausea, vomiting, reactions to medication both toxic or allergic, and postpartum back pain.  Patient expressed understanding and wished to proceed.  All questions were answered.  Sterile technique used throughout procedure and epidural site dressed with sterile barrier dressing. No paresthesia or other complications noted.The patient did not experience any signs of intravascular injection such as tinnitus or metallic taste in mouth nor signs of intrathecal spread such as rapid motor block. Please see nursing notes for vital signs.

## 2013-10-31 NOTE — Progress Notes (Signed)
Notified pt ctxing q3 minutes, gbs neg., cervix 5/80/-2, bulging membranes. Will admit pt

## 2013-10-31 NOTE — H&P (Signed)
Lauretta GrillKathryn Gardin is a 38 y.o. female presenting for labor.5 cm dilated in triage Maternal Medical History:  Reason for admission: Contractions.   Contractions: Frequency: regular.   Perceived severity is strong.    Fetal activity: Perceived fetal activity is normal.    Prenatal complications: no prenatal complications   OB History   Grav Para Term Preterm Abortions TAB SAB Ect Mult Living   2 1 1       1      Past Medical History  Diagnosis Date  . CIN III with severe dysplasia    Past Surgical History  Procedure Laterality Date  . Combined hysteroscopy diagnostic / d&c    . Cervix lesion destruction    . Tonsillectomy    . Breast enhancement surgery    . Labial hypertrophy    . Colposcopy     Family History: family history includes Cervical cancer in her maternal grandmother and mother; Diabetes in her maternal grandmother; Hypertension in her father; Ovarian cancer in her maternal grandmother and paternal grandmother. Social History:  reports that she has never smoked. She has never used smokeless tobacco. She reports that she does not drink alcohol or use illicit drugs.   Prenatal Transfer Tool  Maternal Diabetes: No Genetic Screening: Normal Maternal Ultrasounds/Referrals: Normal Fetal Ultrasounds or other Referrals:  None Maternal Substance Abuse:  No Significant Maternal Medications:  None Significant Maternal Lab Results:  None Other Comments:  None  Review of Systems  All other systems reviewed and are negative.    Dilation: 5 Effacement (%): 80 Station: -2 Exam by:: SBeck, RN Blood pressure 118/87, pulse 62, height 5\' 4"  (1.626 m), weight 64.581 kg (142 lb 6 oz). Maternal Exam:  Uterine Assessment: Contraction strength is moderate.  Contraction frequency is regular.   Abdomen: Fetal presentation: vertex     Fetal Exam Fetal State Assessment: Category I - tracings are normal.     Physical Exam  Nursing note and vitals  reviewed. Constitutional: She appears well-developed.  HENT:  Head: Normocephalic.  Eyes: Pupils are equal, round, and reactive to light.  Neck: Normal range of motion.  Cardiovascular: Normal rate.   Respiratory: Effort normal.    Prenatal labs: ABO, Rh: A/Positive/-- (09/03 0000) Antibody: Negative (09/03 0000) Rubella: Immune (09/03 0000) RPR: Nonreactive (09/03 0000)  HBsAg: Negative (09/03 0000)  HIV: Non-reactive (09/03 0000)  GBS: Negative (03/05 0000)   Assessment/Plan: IUP at 38 w 1 day Labor Thrombocytopenia Patient wants epidural - counseled by Dr. Jean RosenthalJackson about risks Anticipate NSVD  Alyze Lauf L 10/31/2013, 3:42 PM

## 2013-10-31 NOTE — Progress Notes (Signed)
SVE, cervix is softer, stretchy, and anterior. Tight fore bag, unable to feel sutures to determine fetal position. Repositioned patient, peri care. Updated Dr. Vincente PoliGrewal re: patient status and updated patient and family re: care plan.

## 2013-10-31 NOTE — MAU Note (Signed)
Pt states ctx's q3 minutes apart, denies bleeding or lof.. Did pass mucus plug.

## 2013-11-01 ENCOUNTER — Encounter (HOSPITAL_COMMUNITY): Payer: Self-pay | Admitting: *Deleted

## 2013-11-01 ENCOUNTER — Encounter (HOSPITAL_COMMUNITY)
Admission: AD | Disposition: A | Discharge status: Home / Self Care | Payer: Self-pay | Source: Ambulatory Visit | Attending: Obstetrics and Gynecology

## 2013-11-01 HISTORY — PX: DILATION AND CURETTAGE OF UTERUS: SHX78

## 2013-11-01 LAB — CBC
HCT: 27.7 % — ABNORMAL LOW (ref 36.0–46.0)
Hemoglobin: 9.5 g/dL — ABNORMAL LOW (ref 12.0–15.0)
MCH: 30 pg (ref 26.0–34.0)
MCHC: 34.3 g/dL (ref 30.0–36.0)
MCV: 87.4 fL (ref 78.0–100.0)
PLATELETS: 97 10*3/uL — AB (ref 150–400)
RBC: 3.17 MIL/uL — ABNORMAL LOW (ref 3.87–5.11)
RDW: 12.9 % (ref 11.5–15.5)
WBC: 12.1 10*3/uL — ABNORMAL HIGH (ref 4.0–10.5)

## 2013-11-01 SURGERY — DILATION AND CURETTAGE
Anesthesia: Epidural

## 2013-11-01 MED ORDER — DIPHENHYDRAMINE HCL 50 MG/ML IJ SOLN: 12.5000 mg | INTRAMUSCULAR | Status: DC | PRN

## 2013-11-01 MED ORDER — MEASLES, MUMPS & RUBELLA VAC ~~LOC~~ INJ
0.5000 mL | INJECTION | Freq: Once | SUBCUTANEOUS | Status: DC
  Filled 2013-11-01: qty 0.5

## 2013-11-01 MED ORDER — PHENYLEPHRINE HCL 10 MG/ML IJ SOLN
INTRAMUSCULAR | Status: DC | PRN
  Administered 2013-11-01 (×3): 200 ug via INTRAVENOUS

## 2013-11-01 MED ORDER — LACTATED RINGERS IV SOLN: INTRAVENOUS | Status: DC

## 2013-11-01 MED ORDER — DIPHENHYDRAMINE HCL 25 MG PO CAPS: 25.0000 mg | ORAL_CAPSULE | Freq: Four times a day (QID) | ORAL | Status: DC | PRN

## 2013-11-01 MED ORDER — PRENATAL MULTIVITAMIN CH
1.0000 | ORAL_TABLET | Freq: Every day | ORAL | Status: DC
  Administered 2013-11-01 – 2013-11-02 (×2): 1 via ORAL
  Filled 2013-11-01 (×2): qty 1

## 2013-11-01 MED ORDER — ONDANSETRON HCL 4 MG/2ML IJ SOLN
INTRAMUSCULAR | Status: DC | PRN
Start: 2013-11-01 — End: 2013-11-01
  Administered 2013-11-01: 4 mg via INTRAVENOUS

## 2013-11-01 MED ORDER — SCOPOLAMINE 1 MG/3DAYS TD PT72
MEDICATED_PATCH | TRANSDERMAL | Status: AC
  Filled 2013-11-01: qty 1

## 2013-11-01 MED ORDER — EPHEDRINE SULFATE 50 MG/ML IJ SOLN
INTRAMUSCULAR | Status: DC | PRN
  Administered 2013-11-01: 50 mg via INTRAVENOUS

## 2013-11-01 MED ORDER — BENZOCAINE-MENTHOL 20-0.5 % EX AERO: 1.0000 "application " | INHALATION_SPRAY | CUTANEOUS | Status: DC | PRN

## 2013-11-01 MED ORDER — ONDANSETRON HCL 4 MG/2ML IJ SOLN: 4.0000 mg | INTRAMUSCULAR | Status: DC | PRN

## 2013-11-01 MED ORDER — NITROGLYCERIN 0.4 MG/SPRAY TL SOLN
Status: DC | PRN
  Administered 2013-11-01: 3 via SUBLINGUAL

## 2013-11-01 MED ORDER — LANOLIN HYDROUS EX OINT: TOPICAL_OINTMENT | CUTANEOUS | Status: DC | PRN

## 2013-11-01 MED ORDER — DIBUCAINE 1 % RE OINT: 1.0000 "application " | TOPICAL_OINTMENT | RECTAL | Status: DC | PRN

## 2013-11-01 MED ORDER — NITROGLYCERIN 0.4 MG/SPRAY TL SOLN
Status: AC
  Filled 2013-11-01: qty 4.9

## 2013-11-01 MED ORDER — ONDANSETRON HCL 4 MG PO TABS: 4.0000 mg | ORAL_TABLET | ORAL | Status: DC | PRN

## 2013-11-01 MED ORDER — ZOLPIDEM TARTRATE 5 MG PO TABS: 5.0000 mg | ORAL_TABLET | Freq: Every evening | ORAL | Status: DC | PRN

## 2013-11-01 MED ORDER — ONDANSETRON HCL 4 MG/2ML IJ SOLN: 4.0000 mg | Freq: Three times a day (TID) | INTRAMUSCULAR | Status: DC | PRN

## 2013-11-01 MED ORDER — FLEET ENEMA 7-19 GM/118ML RE ENEM: 1.0000 | ENEMA | Freq: Every day | RECTAL | Status: DC | PRN

## 2013-11-01 MED ORDER — TETANUS-DIPHTH-ACELL PERTUSSIS 5-2.5-18.5 LF-MCG/0.5 IM SUSP
0.5000 mL | Freq: Once | INTRAMUSCULAR | Status: AC
  Administered 2013-11-02: 0.5 mL via INTRAMUSCULAR
  Filled 2013-11-01: qty 0.5

## 2013-11-01 MED ORDER — METOCLOPRAMIDE HCL 5 MG/ML IJ SOLN: 10.0000 mg | Freq: Three times a day (TID) | INTRAMUSCULAR | Status: DC | PRN

## 2013-11-01 MED ORDER — NALOXONE HCL 0.4 MG/ML IJ SOLN: 0.4000 mg | INTRAMUSCULAR | Status: DC | PRN

## 2013-11-01 MED ORDER — ONDANSETRON HCL 4 MG/2ML IJ SOLN
INTRAMUSCULAR | Status: AC
  Filled 2013-11-01: qty 2

## 2013-11-01 MED ORDER — DIPHENHYDRAMINE HCL 50 MG/ML IJ SOLN: 25.0000 mg | INTRAMUSCULAR | Status: DC | PRN

## 2013-11-01 MED ORDER — IBUPROFEN 600 MG PO TABS
600.0000 mg | ORAL_TABLET | Freq: Four times a day (QID) | ORAL | Status: DC
  Administered 2013-11-01 – 2013-11-02 (×5): 600 mg via ORAL
  Filled 2013-11-01 (×5): qty 1

## 2013-11-01 MED ORDER — BISACODYL 10 MG RE SUPP: 10.0000 mg | Freq: Every day | RECTAL | Status: DC | PRN

## 2013-11-01 MED ORDER — PHENYLEPHRINE HCL 10 MG/ML IJ SOLN
INTRAMUSCULAR | Status: AC
  Filled 2013-11-01: qty 1

## 2013-11-01 MED ORDER — OXYCODONE-ACETAMINOPHEN 5-325 MG PO TABS: 1.0000 | ORAL_TABLET | ORAL | Status: DC | PRN

## 2013-11-01 MED ORDER — SENNOSIDES-DOCUSATE SODIUM 8.6-50 MG PO TABS
2.0000 | ORAL_TABLET | ORAL | Status: DC
  Administered 2013-11-01: 2 via ORAL
  Filled 2013-11-01: qty 2

## 2013-11-01 MED ORDER — DIPHENHYDRAMINE HCL 25 MG PO CAPS: 25.0000 mg | ORAL_CAPSULE | ORAL | Status: DC | PRN

## 2013-11-01 MED ORDER — WITCH HAZEL-GLYCERIN EX PADS: 1.0000 "application " | MEDICATED_PAD | CUTANEOUS | Status: DC | PRN

## 2013-11-01 MED ORDER — SCOPOLAMINE 1 MG/3DAYS TD PT72
1.0000 | MEDICATED_PATCH | Freq: Once | TRANSDERMAL | Status: DC
  Administered 2013-11-01: 1.5 mg via TRANSDERMAL

## 2013-11-01 MED ORDER — SODIUM CHLORIDE 0.9 % IJ SOLN: 3.0000 mL | INTRAMUSCULAR | Status: DC | PRN

## 2013-11-01 MED ORDER — MEDROXYPROGESTERONE ACETATE 150 MG/ML IM SUSP: 150.0000 mg | INTRAMUSCULAR | Status: DC | PRN

## 2013-11-01 MED ORDER — CHLOROPROCAINE HCL 3 % IJ SOLN
INTRAMUSCULAR | Status: DC | PRN
  Administered 2013-11-01: 300 mg

## 2013-11-01 MED ORDER — NALOXONE HCL 1 MG/ML IJ SOLN
1.0000 ug/kg/h | INTRAVENOUS | Status: DC | PRN
  Filled 2013-11-01: qty 2

## 2013-11-01 MED ORDER — SIMETHICONE 80 MG PO CHEW: 80.0000 mg | CHEWABLE_TABLET | ORAL | Status: DC | PRN

## 2013-11-01 SURGICAL SUPPLY — 14 items
CATH ROBINSON RED A/P 16FR (CATHETERS) ×3 IMPLANT
CLOTH BEACON ORANGE TIMEOUT ST (SAFETY) ×3 IMPLANT
CONTAINER PREFILL 10% NBF 60ML (FORM) IMPLANT
DRSG TELFA 3X8 NADH (GAUZE/BANDAGES/DRESSINGS) IMPLANT
GLOVE BIO SURGEON STRL SZ 6.5 (GLOVE) ×2 IMPLANT
GLOVE BIO SURGEONS STRL SZ 6.5 (GLOVE) ×1
GOWN STRL REUS W/TWL LRG LVL3 (GOWN DISPOSABLE) ×3 IMPLANT
NEEDLE SPNL 22GX3.5 QUINCKE BK (NEEDLE) IMPLANT
PACK VAGINAL MINOR WOMEN LF (CUSTOM PROCEDURE TRAY) ×3 IMPLANT
PAD OB MATERNITY 4.3X12.25 (PERSONAL CARE ITEMS) ×3 IMPLANT
PAD PREP 24X48 CUFFED NSTRL (MISCELLANEOUS) ×3 IMPLANT
SYR CONTROL 10ML LL (SYRINGE) IMPLANT
TOWEL OR 17X24 6PK STRL BLUE (TOWEL DISPOSABLE) ×6 IMPLANT
WATER STERILE IRR 1000ML POUR (IV SOLUTION) IMPLANT

## 2013-11-01 NOTE — Op Note (Signed)
NAMMarland Kitchen:  Ann NovelDEFOSSE, Ann Haas             ACCOUNT NO.:  [id number]  MEDICAL RECORD NO.:  [id number]  LOCATION:  [facility]                          FACILITY:  [facility]  PHYSICIAN:  Kimmie Berggren L. Emory Leaver, M.D.DATE OF BIRTH:  [birth year]  DATE OF PROCEDURE:  11/01/2013 DATE OF DISCHARGE:                              OPERATIVE REPORT   PREOPERATIVE DIAGNOSIS:  Uterine inversion.  POSTOPERATIVE DIAGNOSIS:  Uterine inversion.  PROCEDURE:  Correction of uterine inversion.  SURGEON:  Hassaan Crite L. Vincente PoliGrewal, M.D.  ANESTHESIA:  Epidural.  EBL:  500 mL.  COMPLICATIONS:  None.  DESCRIPTION OF PROCEDURE:  This patient is a 38 year old, gravida 2, para 2, who delivered by NSVD.  After delivery of the baby, the placenta was delivered easily and spontaneously; however, it did appear that she may have had a placental abruption because there was some port-wine- colored amniotic fluid noted after delivery of the baby.  After delivery, she had no bleeding at all.  Approximately 10 minutes after I left the room, the nurse informed me that the patient was having very heavy vaginal bleeding.  I immediately went back to the labor and delivery room, examined the patient and the patient had developed the uterine inversion.  At that point, I notified the OR, we immediately took the patient to the operating room.  The anesthesiologist dosed her epidural and gave her nitroglycerin.  I was then able to easily reduce the uterine inversion.  Bleeding was minimal-to-none.  The patient tolerated procedure well and she immediately felt much better after correction of the uterine inversion.  All instruments, sponge and lap counts were correct x2.  The patient went to recovery room in stable condition.     Kaeleigh Westendorf L. Vincente PoliGrewal, M.D.     Florestine AversMLG/MEDQ  D:  11/01/2013  T:  11/01/2013  Job:  [id number]

## 2013-11-01 NOTE — Progress Notes (Signed)
Dr Willa Fraterarrigan and Dr Vincente PoliGrewal order not to pull Epidural until  afternoon.  11/01/2012

## 2013-11-01 NOTE — Anesthesia Postprocedure Evaluation (Signed)
  Anesthesia Post-op Note  Anesthesia Post Note  Patient: Lauretta GrillKathryn Whitter  Procedure(s) Performed: Procedure(s) (LRB): Reinverted Uterus (N/A)  Anesthesia type: Epidural  Patient location: Mother/Baby  Post pain: Pain level controlled  Post assessment: Post-op Vital signs reviewed  Last Vitals:  Filed Vitals:   11/01/13 0710  BP: 105/71  Pulse: 100  Temp:   Resp: 18    Post vital signs: Reviewed  Level of consciousness:alert  Complications: No apparent anesthesia complications

## 2013-11-01 NOTE — Progress Notes (Signed)
Patient is comfortable and feeling pressure FHR is Category 1 with variables Cervix is C/C/+2 Forebag ruptured Clear fluid Start pushing

## 2013-11-01 NOTE — Brief Op Note (Signed)
10/31/2013 - 11/01/2013  4:04 AM  PATIENT:  Lauretta GrillKathryn Bojarski  38 y.o. female  PRE-OPERATIVE DIAGNOSIS:  Uterine Inversion  POST-OPERATIVE DIAGNOSIS:  Same  PROCEDURE:  Correction of Uterine Inversion  SURGEON:  Surgeon(s) and Role:    * Jeani HawkingMichelle L Gabrian Hoque, MD - Primary  PHYSICIAN ASSISTANT:   ASSISTANTS: none   ANESTHESIA:   epidural  EBL:  Total I/O In: -  Out: 900 [Urine:900]  BLOOD ADMINISTERED:none  DRAINS: none   LOCAL MEDICATIONS USED:  NONE  SPECIMEN:  No Specimen  DISPOSITION OF SPECIMEN:  N/A  COUNTS:  YES  TOURNIQUET:  * No tourniquets in log *  DICTATION: .Other Dictation: Dictation Number [id number]  PLAN OF CARE: Admit to inpatient   PATIENT DISPOSITION:  PACU - hemodynamically stable.   Delay start of Pharmacological VTE agent (>24hrs) due to surgical blood loss or risk of bleeding: not applicable

## 2013-11-01 NOTE — Progress Notes (Signed)
Approximately 10 minutes after I left the room, the nurse called me back in because the patient's bleeding went from nothing to very heavy Exam consistent with uterine eversion. Immediately transported patient to OR and epidural dosed and given nitroglycerin. Easily inverted the uterus in the OR. Patient immediately felt "much Better" To PACU. EBL approximately 500 cc

## 2013-11-01 NOTE — Transfer of Care (Signed)
Immediate Anesthesia Transfer of Care Note  Patient: Ann Haas  Procedure(s) Performed: Procedure(s): Reinverted Uterus (N/A)  Patient Location: PACU  Anesthesia Type:Epidural  Level of Consciousness: awake, alert  and oriented  Airway & Oxygen Therapy: Patient Spontanous Breathing and Patient connected to nasal cannula oxygen  Post-op Assessment: Report given to PACU RN and Post -op Vital signs reviewed and stable  Post vital signs: Reviewed and stable  Complications: No apparent anesthesia complications

## 2013-11-01 NOTE — Progress Notes (Signed)
Post Partum Day 0 Subjective: no complaints, up ad lib and voiding  Objective: Blood pressure 108/72, pulse 75, temperature 97.8 F (36.6 C), temperature source Oral, resp. rate 18, height 5\' 4"  (1.626 m), weight 64.581 kg (142 lb 6 oz), SpO2 100.00%, unknown if currently breastfeeding.  Physical Exam:  General: alert, cooperative and appears stated age Lochia: appropriate Uterine Fundus: firm Incision: healing well, no significant drainage, no dehiscence, no significant erythema DVT Evaluation: No evidence of DVT seen on physical exam.   Recent Labs  10/31/13 1532 11/01/13 0355  HGB 13.3 9.5*  HCT 38.0 27.7*    Assessment/Plan: Breastfeeding   LOS: 1 day   Vila Dory L 11/01/2013, 7:22 AM

## 2013-11-01 NOTE — Progress Notes (Signed)
0236  SVE Dr. Vincente PoliGrewal, AROM of bulging forebag, moderate amount of bloody fluid. Patient ready to push per Dr. Vincente PoliGrewal.

## 2013-11-01 NOTE — Anesthesia Postprocedure Evaluation (Signed)
  Anesthesia Post-op Note  Patient: Lauretta GrillKathryn Markwardt  Procedure(s) Performed: Procedure(s) (LRB): Reinverted Uterus (N/A)  Patient Location: PACU  Anesthesia Type: Epidural  Level of Consciousness: awake and alert   Airway and Oxygen Therapy: Patient Spontanous Breathing  Post-op Pain: mild  Post-op Assessment: Post-op Vital signs reviewed, Patient's Cardiovascular Status Stable, Respiratory Function Stable, Patent Airway and No signs of Nausea or vomiting  Last Vitals:  Filed Vitals:   11/01/13 0400  BP: 141/85  Pulse: 76  Temp: 36.8 C  Resp: 18    Post-op Vital Signs: stable   Complications: No apparent anesthesia complications

## 2013-11-02 NOTE — Discharge Summary (Signed)
Obstetric Discharge Summary Reason for Admission: onset of labor Prenatal Procedures: none Intrapartum Procedures: spontaneous vaginal delivery Postpartum Procedures: correction of uterine inversion Complications-Operative and Postpartum: uterine inversion Hemoglobin  Date Value Ref Range Status  11/01/2013 9.5* 12.0 - 15.0 g/dL Final     DELTA CHECK NOTED     REPEATED TO VERIFY     HCT  Date Value Ref Range Status  11/01/2013 27.7* 36.0 - 46.0 % Final    Physical Exam:  General: alert, cooperative and appears stated age 15Lochia: appropriate Uterine Fundus: firm Incision: healing well, no significant drainage, no dehiscence, no significant erythema DVT Evaluation: No evidence of DVT seen on physical exam.  Discharge Diagnoses: Term Pregnancy-delivered  Discharge Information: Date: 11/02/2013 Activity: pelvic rest Diet: routine Medications: None Condition: improved Instructions: refer to practice specific booklet Discharge to: home   Newborn Data: Live born female  Birth Weight: 6 lb 10 oz (3005 g) APGAR: 9, 9  Home with mother.  Margaretmary Prisk L 11/02/2013, 6:51 AM

## 2013-11-02 NOTE — Addendum Note (Signed)
Addendum created 11/02/13 1007 by Turner DanielsJennifer L Tanishka Drolet, CRNA   Modules edited: Notes Section   Notes Section:  File: [id number]

## 2013-11-02 NOTE — Anesthesia Postprocedure Evaluation (Signed)
  Anesthesia Post-op Note  Anesthesia Post Note  Patient: Ann Haas  Procedure(s) Performed: Procedure(s) (LRB): Reinverted Uterus (N/A)  Anesthesia type: Epidural  Patient location: Mother/Baby  Post pain: Pain level controlled  Post assessment: Post-op Vital signs reviewed  Last Vitals:  Filed Vitals:   11/02/13 0650  BP: 96/54  Pulse: 67  Temp: 36.6 C  Resp: 18    Post vital signs: Reviewed  Level of consciousness:alert  Complications: No apparent anesthesia complications

## 2013-11-02 NOTE — Progress Notes (Signed)
Post Partum Day 2 Subjective: no complaints  Objective: Blood pressure 99/59, pulse 63, temperature 98.8 F (37.1 C), temperature source Oral, resp. rate 18, height 5\' 4"  (1.626 m), weight 64.581 kg (142 lb 6 oz), SpO2 100.00%, unknown if currently breastfeeding.  Physical Exam:  General: alert, cooperative and appears stated age Lochia: appropriate Uterine Fundus: firm Incision: healing well, no significant drainage, no dehiscence, no significant erythema DVT Evaluation: No evidence of DVT seen on physical exam.   Recent Labs  10/31/13 1532 11/01/13 0355  HGB 13.3 9.5*  HCT 38.0 27.7*    Assessment/Plan: Discharge home and Circumcision prior to discharge   LOS: 2 days   Ann Haas L 11/02/2013, 6:51 AM

## 2013-11-03 ENCOUNTER — Encounter (HOSPITAL_COMMUNITY): Payer: Self-pay | Admitting: Obstetrics and Gynecology

## 2013-11-06 ENCOUNTER — Ambulatory Visit (HOSPITAL_COMMUNITY): Payer: No Typology Code available for payment source

## 2014-06-03 ENCOUNTER — Other Ambulatory Visit: Payer: Self-pay | Admitting: Obstetrics and Gynecology

## 2014-06-04 LAB — CYTOLOGY - PAP

## 2014-06-15 ENCOUNTER — Encounter (HOSPITAL_COMMUNITY): Payer: Self-pay | Admitting: Obstetrics and Gynecology

## 2014-12-10 ENCOUNTER — Other Ambulatory Visit: Payer: Self-pay | Admitting: Obstetrics and Gynecology

## 2014-12-14 LAB — CYTOLOGY - PAP

## 2014-12-23 IMAGING — CR DG CHEST 2V
2 series · 2 of 2 positions shown · non-contrast
Comparison: None.

CLINICAL DATA: Shortness of breath and leg swelling. Twenty weeks
pregnant. The patient was double shielded.

EXAM:
CHEST  2 VIEW

[view not recorded (1 of 2)]
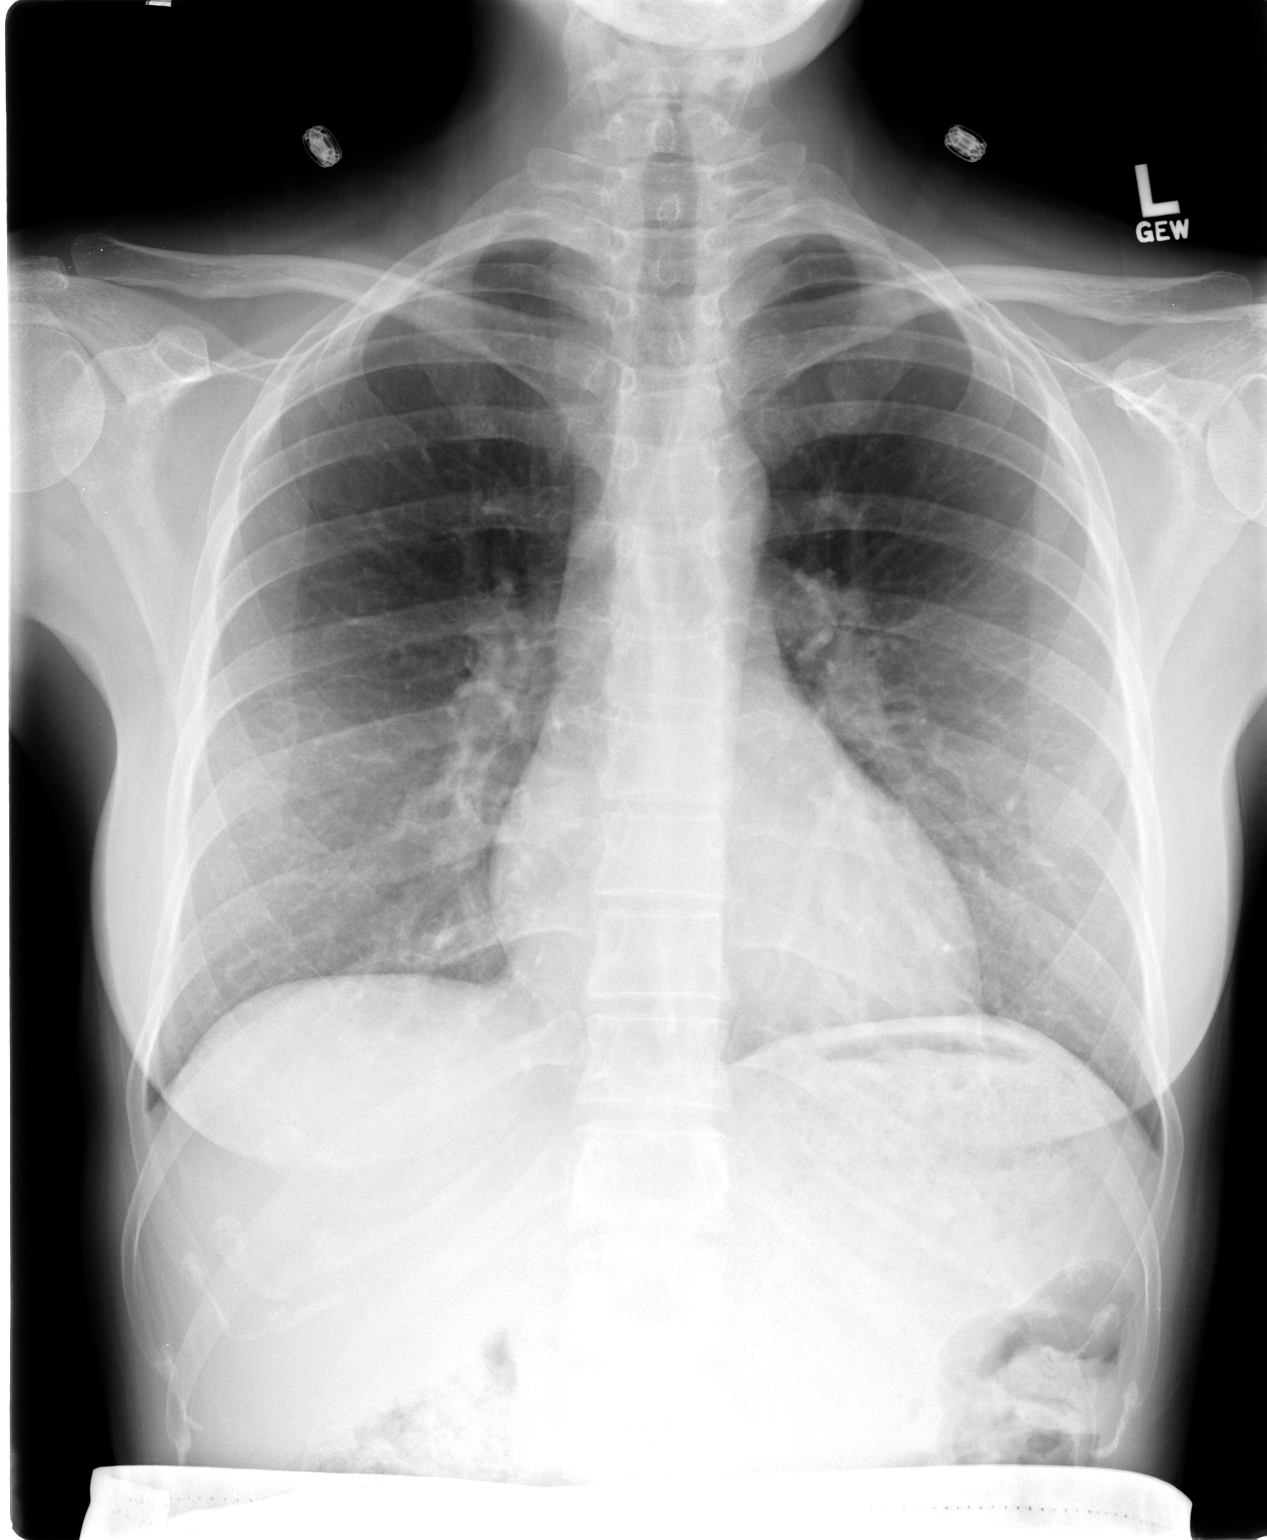

[view not recorded (2 of 2)]
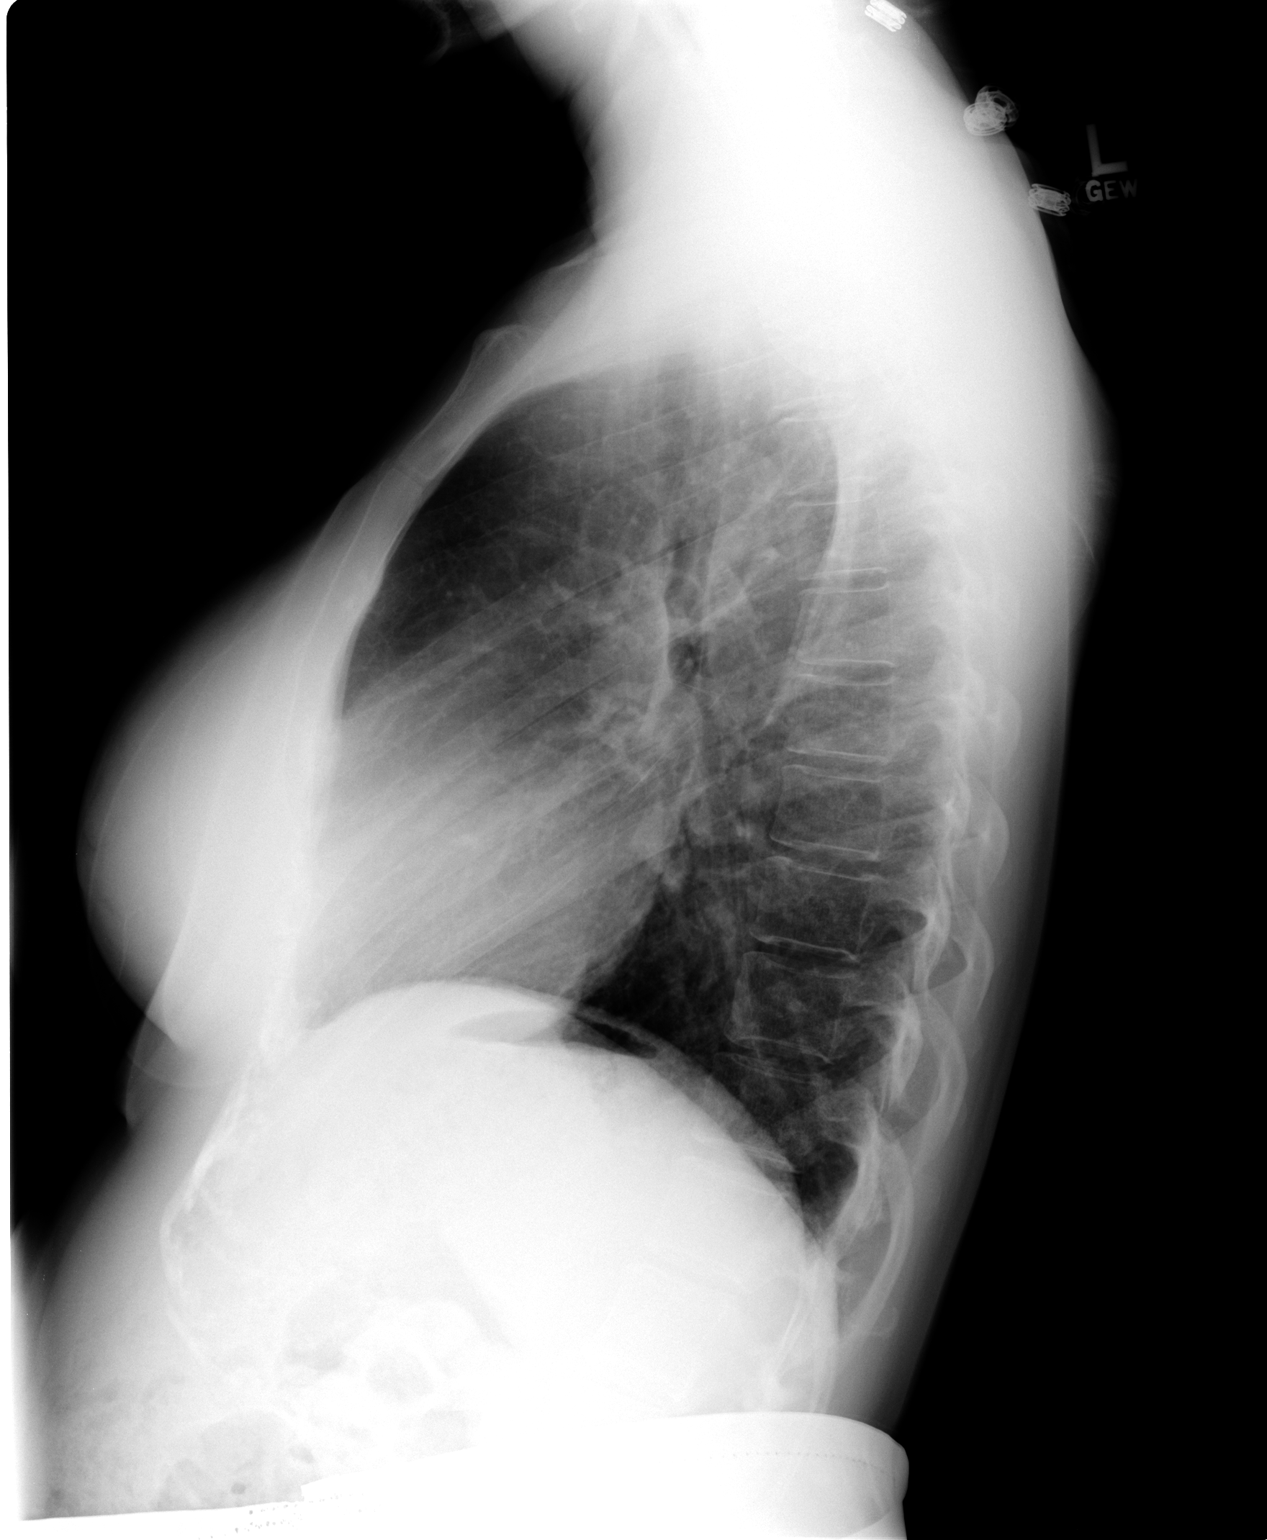

[2 of 2 positions shown; findings below may reference images not displayed]

FINDINGS: The heart size and mediastinal contours are within normal limits.
Both lungs are clear. The visualized skeletal structures are
unremarkable.
IMPRESSION: No active cardiopulmonary disease.
